# Patient Record
Sex: Female | Born: 1980 | Race: White | Hispanic: No | Marital: Married | Smoking: Never smoker
Health system: Southern US, Community
[De-identification: ages and names within clinical notes are randomized; demographics above are authoritative.]

## PROBLEM LIST (undated history)

## (undated) DIAGNOSIS — Z8619 Personal history of other infectious and parasitic diseases: Secondary | ICD-10-CM

## (undated) DIAGNOSIS — O09529 Supervision of elderly multigravida, unspecified trimester: Secondary | ICD-10-CM

## (undated) HISTORY — DX: Personal history of other infectious and parasitic diseases: Z86.19

## (undated) HISTORY — DX: Supervision of elderly multigravida, unspecified trimester: O09.529

## (undated) HISTORY — PX: WISDOM TOOTH EXTRACTION: SHX21

---

## 2014-10-05 LAB — OB RESULTS CONSOLE HEPATITIS B SURFACE ANTIGEN: Hepatitis B Surface Ag: NEGATIVE

## 2014-10-05 LAB — OB RESULTS CONSOLE ABO/RH: RH TYPE: NEGATIVE

## 2014-10-05 LAB — OB RESULTS CONSOLE RUBELLA ANTIBODY, IGM: Rubella: IMMUNE

## 2014-10-05 LAB — OB RESULTS CONSOLE GC/CHLAMYDIA
Chlamydia: NEGATIVE
Gonorrhea: NEGATIVE

## 2014-10-05 LAB — OB RESULTS CONSOLE ANTIBODY SCREEN: ANTIBODY SCREEN: NEGATIVE

## 2014-10-05 LAB — OB RESULTS CONSOLE HIV ANTIBODY (ROUTINE TESTING): HIV: NONREACTIVE

## 2014-10-05 LAB — OB RESULTS CONSOLE RPR: RPR: NONREACTIVE

## 2014-10-28 NOTE — L&D Delivery Note (Signed)
Delivery Note  SVD viable female Apgars 8,9 over 2nd degree ml lac.  Nuchal x 2 reduced.  Placenta delivered spontaneously intact with 3VC. Repair with 2-0 Chromic with good support and hemostasis noted and R/V exam confirms.  PH art was sent.  [place] cord blood was not done.  Mother and baby were doing well.  EBL 35cc  Candice Camp, MD

## 2015-05-16 ENCOUNTER — Encounter (HOSPITAL_COMMUNITY): Payer: Self-pay | Admitting: *Deleted

## 2015-05-16 ENCOUNTER — Telehealth (HOSPITAL_COMMUNITY): Payer: Self-pay | Admitting: *Deleted

## 2015-05-16 LAB — OB RESULTS CONSOLE GBS: GBS: NEGATIVE

## 2015-05-16 NOTE — Telephone Encounter (Signed)
Preadmission screen  

## 2015-05-19 ENCOUNTER — Inpatient Hospital Stay (HOSPITAL_COMMUNITY): Payer: 59 | Admitting: Anesthesiology

## 2015-05-19 ENCOUNTER — Encounter (HOSPITAL_COMMUNITY): Payer: Self-pay

## 2015-05-19 ENCOUNTER — Inpatient Hospital Stay (HOSPITAL_COMMUNITY)
Admission: RE | Admit: 2015-05-19 | Discharge: 2015-05-21 | DRG: 775 | Disposition: A | Discharge status: Home / Self Care | Payer: 59 | Source: Ambulatory Visit | Attending: Obstetrics and Gynecology | Admitting: Obstetrics and Gynecology

## 2015-05-19 DIAGNOSIS — Z3A40 40 weeks gestation of pregnancy: Secondary | ICD-10-CM | POA: Diagnosis present

## 2015-05-19 DIAGNOSIS — O480 Post-term pregnancy: Secondary | ICD-10-CM | POA: Diagnosis present

## 2015-05-19 DIAGNOSIS — O09523 Supervision of elderly multigravida, third trimester: Secondary | ICD-10-CM | POA: Diagnosis not present

## 2015-05-19 DIAGNOSIS — Z833 Family history of diabetes mellitus: Secondary | ICD-10-CM

## 2015-05-19 LAB — RPR: RPR Ser Ql: NONREACTIVE

## 2015-05-19 LAB — CBC
HCT: 34.8 % — ABNORMAL LOW (ref 36.0–46.0)
Hemoglobin: 11.9 g/dL — ABNORMAL LOW (ref 12.0–15.0)
MCH: 29.1 pg (ref 26.0–34.0)
MCHC: 34.2 g/dL (ref 30.0–36.0)
MCV: 85.1 fL (ref 78.0–100.0)
Platelets: 208 10*3/uL (ref 150–400)
RBC: 4.09 MIL/uL (ref 3.87–5.11)
RDW: 14.2 % (ref 11.5–15.5)
WBC: 12.5 10*3/uL — AB (ref 4.0–10.5)

## 2015-05-19 MED ORDER — ACETAMINOPHEN 325 MG PO TABS: 650.0000 mg | ORAL_TABLET | ORAL | Status: DC | PRN

## 2015-05-19 MED ORDER — DIPHENHYDRAMINE HCL 25 MG PO CAPS: 25.0000 mg | ORAL_CAPSULE | Freq: Four times a day (QID) | ORAL | Status: DC | PRN

## 2015-05-19 MED ORDER — PRENATAL MULTIVITAMIN CH
1.0000 | ORAL_TABLET | Freq: Every day | ORAL | Status: DC
  Administered 2015-05-20: 1 via ORAL
  Filled 2015-05-19: qty 1

## 2015-05-19 MED ORDER — IBUPROFEN 600 MG PO TABS
600.0000 mg | ORAL_TABLET | Freq: Four times a day (QID) | ORAL | Status: DC
  Administered 2015-05-19 – 2015-05-21 (×6): 600 mg via ORAL
  Filled 2015-05-19 (×6): qty 1

## 2015-05-19 MED ORDER — BENZOCAINE-MENTHOL 20-0.5 % EX AERO
1.0000 "application " | INHALATION_SPRAY | CUTANEOUS | Status: DC | PRN
  Administered 2015-05-19 – 2015-05-21 (×2): 1 via TOPICAL
  Filled 2015-05-19 (×2): qty 56

## 2015-05-19 MED ORDER — PHENYLEPHRINE 40 MCG/ML (10ML) SYRINGE FOR IV PUSH (FOR BLOOD PRESSURE SUPPORT)
80.0000 ug | PREFILLED_SYRINGE | INTRAVENOUS | Status: DC | PRN
  Filled 2015-05-19: qty 20
  Filled 2015-05-19: qty 2
  Filled 2015-05-19: qty 20

## 2015-05-19 MED ORDER — MEDROXYPROGESTERONE ACETATE 150 MG/ML IM SUSP: 150.0000 mg | INTRAMUSCULAR | Status: DC | PRN

## 2015-05-19 MED ORDER — ONDANSETRON HCL 4 MG/2ML IJ SOLN: 4.0000 mg | Freq: Four times a day (QID) | INTRAMUSCULAR | Status: DC | PRN

## 2015-05-19 MED ORDER — OXYTOCIN 40 UNITS IN LACTATED RINGERS INFUSION - SIMPLE MED: 62.5000 mL/h | INTRAVENOUS | Status: DC

## 2015-05-19 MED ORDER — LACTATED RINGERS IV SOLN
INTRAVENOUS | Status: DC
  Administered 2015-05-19 (×3): via INTRAVENOUS

## 2015-05-19 MED ORDER — WITCH HAZEL-GLYCERIN EX PADS: 1.0000 "application " | MEDICATED_PAD | CUTANEOUS | Status: DC | PRN

## 2015-05-19 MED ORDER — OXYCODONE-ACETAMINOPHEN 5-325 MG PO TABS: 1.0000 | ORAL_TABLET | ORAL | Status: DC | PRN

## 2015-05-19 MED ORDER — LIDOCAINE HCL (PF) 1 % IJ SOLN
30.0000 mL | INTRAMUSCULAR | Status: DC | PRN
  Filled 2015-05-19: qty 30

## 2015-05-19 MED ORDER — EPHEDRINE 5 MG/ML INJ
INTRAVENOUS | Status: AC
  Filled 2015-05-19: qty 4

## 2015-05-19 MED ORDER — FENTANYL 2.5 MCG/ML BUPIVACAINE 1/10 % EPIDURAL INFUSION (WH - ANES)
14.0000 mL/h | INTRAMUSCULAR | Status: DC | PRN
  Administered 2015-05-19: 14 mL/h via EPIDURAL
  Filled 2015-05-19: qty 125

## 2015-05-19 MED ORDER — LIDOCAINE HCL (PF) 1 % IJ SOLN
INTRAMUSCULAR | Status: DC | PRN
  Administered 2015-05-19: 8 mL via EPIDURAL
  Administered 2015-05-19: 9 mL via EPIDURAL

## 2015-05-19 MED ORDER — LACTATED RINGERS IV SOLN
500.0000 mL | INTRAVENOUS | Status: DC | PRN
  Administered 2015-05-19 (×3): 500 mL via INTRAVENOUS

## 2015-05-19 MED ORDER — MEASLES, MUMPS & RUBELLA VAC ~~LOC~~ INJ: 0.5000 mL | INJECTION | Freq: Once | SUBCUTANEOUS | Status: DC

## 2015-05-19 MED ORDER — DIBUCAINE 1 % RE OINT: 1.0000 "application " | TOPICAL_OINTMENT | RECTAL | Status: DC | PRN

## 2015-05-19 MED ORDER — TERBUTALINE SULFATE 1 MG/ML IJ SOLN
0.2500 mg | Freq: Once | INTRAMUSCULAR | Status: DC | PRN
  Filled 2015-05-19: qty 1

## 2015-05-19 MED ORDER — LANOLIN HYDROUS EX OINT: TOPICAL_OINTMENT | CUTANEOUS | Status: DC | PRN

## 2015-05-19 MED ORDER — CITRIC ACID-SODIUM CITRATE 334-500 MG/5ML PO SOLN: 30.0000 mL | ORAL | Status: DC | PRN

## 2015-05-19 MED ORDER — OXYTOCIN 40 UNITS IN LACTATED RINGERS INFUSION - SIMPLE MED
1.0000 m[IU]/min | INTRAVENOUS | Status: DC
  Administered 2015-05-19: 2 m[IU]/min via INTRAVENOUS
  Filled 2015-05-19: qty 1000

## 2015-05-19 MED ORDER — OXYCODONE-ACETAMINOPHEN 5-325 MG PO TABS: 2.0000 | ORAL_TABLET | ORAL | Status: DC | PRN

## 2015-05-19 MED ORDER — ONDANSETRON HCL 4 MG/2ML IJ SOLN: 4.0000 mg | INTRAMUSCULAR | Status: DC | PRN

## 2015-05-19 MED ORDER — OXYTOCIN BOLUS FROM INFUSION
500.0000 mL | INTRAVENOUS | Status: DC
  Administered 2015-05-19: 500 mL via INTRAVENOUS

## 2015-05-19 MED ORDER — SENNOSIDES-DOCUSATE SODIUM 8.6-50 MG PO TABS
2.0000 | ORAL_TABLET | ORAL | Status: DC
  Administered 2015-05-19 – 2015-05-20 (×2): 2 via ORAL
  Filled 2015-05-19 (×2): qty 2

## 2015-05-19 MED ORDER — ONDANSETRON HCL 4 MG PO TABS: 4.0000 mg | ORAL_TABLET | ORAL | Status: DC | PRN

## 2015-05-19 MED ORDER — TETANUS-DIPHTH-ACELL PERTUSSIS 5-2.5-18.5 LF-MCG/0.5 IM SUSP: 0.5000 mL | Freq: Once | INTRAMUSCULAR | Status: DC

## 2015-05-19 MED ORDER — SIMETHICONE 80 MG PO CHEW: 80.0000 mg | CHEWABLE_TABLET | ORAL | Status: DC | PRN

## 2015-05-19 MED ORDER — ZOLPIDEM TARTRATE 5 MG PO TABS: 5.0000 mg | ORAL_TABLET | Freq: Every evening | ORAL | Status: DC | PRN

## 2015-05-19 MED ORDER — EPHEDRINE 5 MG/ML INJ
10.0000 mg | INTRAVENOUS | Status: AC | PRN
  Administered 2015-05-19: 10 mg via INTRAVENOUS
  Administered 2015-05-19: 15 mg via INTRAVENOUS
  Filled 2015-05-19: qty 4

## 2015-05-19 MED ORDER — DIPHENHYDRAMINE HCL 50 MG/ML IJ SOLN: 12.5000 mg | INTRAMUSCULAR | Status: DC | PRN

## 2015-05-19 NOTE — H&P (Signed)
Suzanne Love is a 34 y.o. female presenting for IOL due to postdates.  Preg complicated by AMA with normal NIPT.. GBS-. History OB History    Gravida Para Term Preterm AB TAB SAB Ectopic Multiple Living   Past Medical History  Diagnosis Date  . Hx of varicella   . AMA (advanced maternal age) multigravida 35+    Past Surgical History  Procedure Laterality Date  . Wisdom tooth extraction     Family History: family history includes Cancer in her father, mother, and paternal grandfather; Diabetes in her paternal grandfather and paternal uncle. Social History:  reports that she has never smoked. She has never used smokeless tobacco. She reports that she does not drink alcohol or use illicit drugs.   Prenatal Transfer Tool  Maternal Diabetes: No Genetic Screening: Normal Maternal Ultrasounds/Referrals: Normal Fetal Ultrasounds or other Referrals:  None Maternal Substance Abuse:  No Significant Maternal Medications:  None Significant Maternal Lab Results:  None Other Comments:  None  ROS  Dilation: 3 Effacement (%): 60 Station: -2 Exam by:: Dr Rana Snare Blood pressure 96/72, pulse 92, temperature 98.2 F (36.8 C), temperature source Oral, resp. rate 20, height  (1.702 m), weight 162 lb (73.483 kg), last menstrual period 08/08/2014. Exam Physical Exam  Prenatal labs: ABO, Rh: O/Negative/-- (12/09 0000) Antibody: Negative (12/09 0000) Rubella: Immune (12/09 0000) RPR: Nonreactive (12/09 0000)  HBsAg: Negative (12/09 0000)  HIV: Non-reactive (12/09 0000)  GBS: Negative (07/19 0000)   Assessment/Plan: IUP at term (post dates) AROM Pitocin Anticipate SVD   Suzanne Love C 05/19/2015, 9:02 AM

## 2015-05-19 NOTE — Anesthesia Preprocedure Evaluation (Signed)
Anesthesia Evaluation  Patient identified by MRN, date of birth, ID band Patient awake    Reviewed: Allergy & Precautions, H&P , NPO status , Patient's Chart, lab work & pertinent test results  Airway Mallampati: I  TM Distance: >3 FB Neck ROM: full    Dental no notable dental hx.    Pulmonary neg pulmonary ROS,  breath sounds clear to auscultation  Pulmonary exam normal       Cardiovascular negative cardio ROS Normal cardiovascular exam    Neuro/Psych negative neurological ROS  negative psych ROS   GI/Hepatic negative GI ROS, Neg liver ROS,   Endo/Other  negative endocrine ROS  Renal/GU negative Renal ROS     Musculoskeletal   Abdominal   Peds  Hematology negative hematology ROS (+)   Anesthesia Other Findings   Reproductive/Obstetrics (+) Pregnancy                             Anesthesia Physical Anesthesia Plan  ASA: II  Anesthesia Plan: Epidural   Post-op Pain Management:    Induction:   Airway Management Planned:   Additional Equipment:   Intra-op Plan:   Post-operative Plan:   Informed Consent: I have reviewed the patients History and Physical, chart, labs and discussed the procedure including the risks, benefits and alternatives for the proposed anesthesia with the patient or authorized representative who has indicated his/her understanding and acceptance.     Plan Discussed with:   Anesthesia Plan Comments:         Anesthesia Quick Evaluation

## 2015-05-19 NOTE — Anesthesia Procedure Notes (Signed)
Epidural Patient location during procedure: OB Start time: 05/19/2015 12:13 PM End time: 05/19/2015 12:17 PM  Staffing Anesthesiologist: Leilani Able Performed by: anesthesiologist   Preanesthetic Checklist Completed: patient identified, surgical consent, pre-op evaluation, timeout performed, IV checked, risks and benefits discussed and monitors and equipment checked  Epidural Patient position: sitting Prep: site prepped and draped and DuraPrep Patient monitoring: continuous pulse ox and blood pressure Approach: midline Location: L3-L4 Injection technique: LOR air  Needle:  Needle type: Tuohy  Needle gauge: 17 G Needle length: 9 cm and 9 Needle insertion depth: 5 cm cm Catheter type: closed end flexible Catheter size: 19 Gauge Catheter at skin depth: 10 cm Test dose: negative and Other  Assessment Sensory level: T9 Events: blood not aspirated, injection not painful, no injection resistance, negative IV test and no paresthesia  Additional Notes Reason for block:procedure for pain

## 2015-05-20 LAB — CBC
HCT: 33.4 % — ABNORMAL LOW (ref 36.0–46.0)
HEMOGLOBIN: 11.2 g/dL — AB (ref 12.0–15.0)
MCH: 28.9 pg (ref 26.0–34.0)
MCHC: 33.5 g/dL (ref 30.0–36.0)
MCV: 86.1 fL (ref 78.0–100.0)
Platelets: 186 10*3/uL (ref 150–400)
RBC: 3.88 MIL/uL (ref 3.87–5.11)
RDW: 14.6 % (ref 11.5–15.5)
WBC: 19.6 10*3/uL — AB (ref 4.0–10.5)

## 2015-05-20 MED ORDER — RHO D IMMUNE GLOBULIN 1500 UNIT/2ML IJ SOSY
300.0000 ug | PREFILLED_SYRINGE | Freq: Once | INTRAMUSCULAR | Status: AC
  Administered 2015-05-20: 300 ug via INTRAMUSCULAR
  Filled 2015-05-20: qty 2

## 2015-05-20 NOTE — Anesthesia Postprocedure Evaluation (Signed)
  Anesthesia Post-op Note  Patient: Suzanne Love  Procedure(s) Performed: * No procedures listed *  Patient Location: Mother/Baby  Anesthesia Type:Epidural  Level of Consciousness: awake, alert , oriented and patient cooperative  Airway and Oxygen Therapy: Patient Spontanous Breathing  Post-op Pain: none  Post-op Assessment: Post-op Vital signs reviewed, Patient's Cardiovascular Status Stable, Respiratory Function Stable, Patent Airway, No headache, No backache and Patient able to bend at knees              Post-op Vital Signs: Reviewed and stable  Last Vitals:  Filed Vitals:   05/20/15 0055  BP: 92/53  Pulse: 63  Temp: 36.7 C  Resp: 16    Complications: No apparent anesthesia complications

## 2015-05-20 NOTE — Lactation Note (Signed)
This note was copied from the chart of Suzanne Jennafer Gladue. Lactation Consultation Note  Patient Name: Suzanne Love Date: 05/20/2015 Reason for consult: Initial assessment Visited with Mom and FOB, baby 23 hrs old.  This is baby's 6th feeding.  Mom states baby sometimes acts sleepy, but with this one, he is very nutritive.  Mom has baby positioned in football hold, supporting his head and her breast.  Encouraged breast compression during feeding.  Manual breast expression recommended for nipples post feedings. Basics reviewed and some questions answered.  Encouraged skin to skin, and feeding baby often on cue.  Discussed probably cluster feedings tonight.  Brochure left in room.  Informed Mom of IP and OP lactation services available to her.  Encouraged her to call for assistance prn.  Follow up in am.    Consult Status Consult Status: Follow-up Date: 05/21/15 Follow-up type: In-patient    Suzanne Love 05/20/2015, 3:21 PM

## 2015-05-20 NOTE — Progress Notes (Signed)
Post Partum Day 1 Subjective: no complaints, up ad lib, voiding and tolerating PO  Objective: Blood pressure 92/53, pulse 63, temperature 98 F (36.7 C), temperature source Oral, resp. rate 16, height  (1.702 m), weight 162 lb (73.483 kg), last menstrual period 08/08/2014, SpO2 98 %, unknown if currently breastfeeding.  Physical Exam:  General: alert, cooperative, appears stated age and no distress Lochia: appropriate Uterine Fundus: firm Incision: healing well DVT Evaluation: No evidence of DVT seen on physical exam.   Recent Labs  05/19/15 0805 05/20/15 0555  HGB 11.9* 11.2*  HCT 34.8* 33.4*    Assessment/Plan: Plan for discharge tomorrow, Breastfeeding and Circumcision prior to discharge   LOS: 1 day   Mikaili Flippin C 05/20/2015, 10:07 AM

## 2015-05-21 ENCOUNTER — Ambulatory Visit: Payer: Self-pay

## 2015-05-21 LAB — TYPE AND SCREEN
ABO/RH(D): O NEG
Antibody Screen: POSITIVE
DAT, IgG: NEGATIVE
UNIT DIVISION: 0
Unit division: 0

## 2015-05-21 LAB — RH IG WORKUP (INCLUDES ABO/RH)
ABO/RH(D): O NEG
Fetal Screen: NEGATIVE
GESTATIONAL AGE(WKS): 40.3
UNIT DIVISION: 0

## 2015-05-21 MED ORDER — IBUPROFEN 600 MG PO TABS: 600.0000 mg | ORAL_TABLET | Freq: Four times a day (QID) | ORAL | Status: AC

## 2015-05-21 NOTE — Lactation Note (Signed)
This note was copied from the chart of Suzanne Laprecious Austill. Lactation Consultation Note  Patient Name: Suzanne Love [id number] Date: 05/21/2015 Reason for consult: Follow-up assessment (mom and baby D/C prior to [facility] visit. )  LC had reviewed the doc flow sheets - 5% weight loss, breast feeding range 10 -40 mins , Latch score 05-04-09  Voids and stools adequate for age. At 0605 Bili 8.4.  Per [facility] RN baby had breast fed 2 x's prior to D/C 45 mins and 15 mins and breast feeding was going well.   Maternal Data    Feeding    LATCH Score/Interventions                      Lactation Tools Discussed/Used     Consult Status Consult Status: Complete Date: 05/21/15    Kathrin Greathouse 05/21/2015, 2:55 PM

## 2015-05-21 NOTE — Discharge Summary (Signed)
Obstetric Discharge Summary Reason for Admission: induction of labor Prenatal Procedures: none Intrapartum Procedures: spontaneous vaginal delivery Postpartum Procedures: none Complications-Operative and Postpartum: none HEMOGLOBIN  Date Value Ref Range Status  05/20/2015 11.2* 12.0 - 15.0 g/dL Final   HCT  Date Value Ref Range Status  05/20/2015 33.4* 36.0 - 46.0 % Final    Physical Exam:  General: alert, cooperative, appears stated age and no distress Lochia: appropriate Uterine Fundus: firm Incision: healing well DVT Evaluation: No evidence of DVT seen on physical exam.  Discharge Diagnoses: Term Pregnancy-delivered  Discharge Information: Date: 05/21/2015 Activity: pelvic rest Diet: routine Medications: Percocet Condition: stable Instructions: refer to practice specific booklet Discharge to: home   Newborn Data: Live born female  Birth Weight: 7 lb 14.8 oz (3595 g) APGAR: 8, 9  Home with mother s/p circumcision.  Brenya Taulbee C 05/21/2015, 9:41 AM

## 2015-11-21 MED FILL — AMOXICILLIN 500 MG CAPSULE: 500 | 10 days supply | Qty: 30 | Fill #0

## 2015-11-21 MED FILL — AZELASTINE HCL 137 MCG SPRY: 0.1 | 30 days supply | Qty: 30 | Fill #0

## 2017-04-03 DIAGNOSIS — L309 Dermatitis, unspecified: Secondary | ICD-10-CM | POA: Diagnosis not present

## 2017-08-15 DIAGNOSIS — L309 Dermatitis, unspecified: Secondary | ICD-10-CM | POA: Diagnosis not present

## 2017-08-15 MED FILL — TRIAMCINOLONE 0.1% CREAM: 0.1 | 10 days supply | Qty: 30 | Fill #0

## 2017-12-28 ENCOUNTER — Other Ambulatory Visit: Payer: Self-pay | Admitting: Family

## 2017-12-28 MED ORDER — OSELTAMIVIR PHOSPHATE 75 MG PO CAPS: 75.0000 mg | ORAL_CAPSULE | Freq: Every day | ORAL | 0 refills | Status: AC

## 2018-04-08 DIAGNOSIS — Z136 Encounter for screening for cardiovascular disorders: Secondary | ICD-10-CM | POA: Diagnosis not present

## 2018-04-08 DIAGNOSIS — Z131 Encounter for screening for diabetes mellitus: Secondary | ICD-10-CM | POA: Diagnosis not present

## 2018-04-08 DIAGNOSIS — Z0000 Encounter for general adult medical examination without abnormal findings: Secondary | ICD-10-CM | POA: Diagnosis not present

## 2019-04-13 DIAGNOSIS — Z01419 Encounter for gynecological examination (general) (routine) without abnormal findings: Secondary | ICD-10-CM | POA: Diagnosis not present

## 2019-04-13 DIAGNOSIS — Z6821 Body mass index (BMI) 21.0-21.9, adult: Secondary | ICD-10-CM | POA: Diagnosis not present

## 2019-04-14 ENCOUNTER — Other Ambulatory Visit: Payer: Self-pay | Admitting: Obstetrics and Gynecology

## 2019-04-14 DIAGNOSIS — N6320 Unspecified lump in the left breast, unspecified quadrant: Secondary | ICD-10-CM

## 2019-04-21 ENCOUNTER — Other Ambulatory Visit: Payer: 59

## 2019-04-26 ENCOUNTER — Ambulatory Visit
Admission: RE | Admit: 2019-04-26 | Discharge: 2019-04-26 | Disposition: A | Discharge status: Home / Self Care | Payer: 59 | Source: Ambulatory Visit | Attending: Obstetrics and Gynecology | Admitting: Obstetrics and Gynecology

## 2019-04-26 ENCOUNTER — Other Ambulatory Visit: Payer: Self-pay

## 2019-04-26 DIAGNOSIS — N6320 Unspecified lump in the left breast, unspecified quadrant: Secondary | ICD-10-CM

## 2019-04-26 DIAGNOSIS — N6489 Other specified disorders of breast: Secondary | ICD-10-CM | POA: Diagnosis not present

## 2019-04-26 DIAGNOSIS — R922 Inconclusive mammogram: Secondary | ICD-10-CM | POA: Diagnosis not present

## 2019-05-05 DIAGNOSIS — Z131 Encounter for screening for diabetes mellitus: Secondary | ICD-10-CM | POA: Diagnosis not present

## 2019-05-05 DIAGNOSIS — Z0000 Encounter for general adult medical examination without abnormal findings: Secondary | ICD-10-CM | POA: Diagnosis not present

## 2019-05-05 DIAGNOSIS — Z13220 Encounter for screening for lipoid disorders: Secondary | ICD-10-CM | POA: Diagnosis not present

## 2019-11-02 ENCOUNTER — Ambulatory Visit: Payer: 59 | Attending: Internal Medicine

## 2019-11-02 DIAGNOSIS — Z20822 Contact with and (suspected) exposure to covid-19: Secondary | ICD-10-CM | POA: Diagnosis not present

## 2019-11-04 LAB — NOVEL CORONAVIRUS, NAA: SARS-CoV-2, NAA: NOT DETECTED

## 2020-02-28 DIAGNOSIS — Z30433 Encounter for removal and reinsertion of intrauterine contraceptive device: Secondary | ICD-10-CM | POA: Diagnosis not present

## 2020-02-28 IMAGING — MG DIGITAL DIAGNOSTIC BILATERAL MAMMOGRAM WITH TOMO AND CAD
8 series · 8 of 24 positions shown · non-contrast
Comparison: None.

CLINICAL DATA: 37-year-old female with the physician palpated left
breast lump.The patient states the discrepancy in size between her
breasts have always been that way.

EXAM:
DIGITAL DIAGNOSTIC BILATERAL MAMMOGRAM WITH CAD AND TOMO
ULTRASOUND LEFT BREAST

[L CC synth-2D]
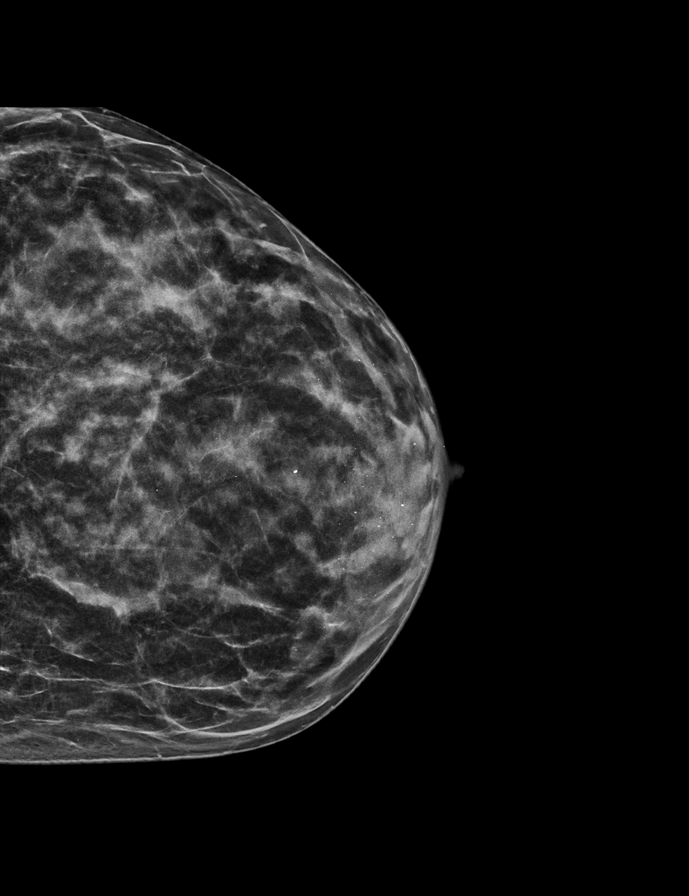

[L MLO synth-2D]
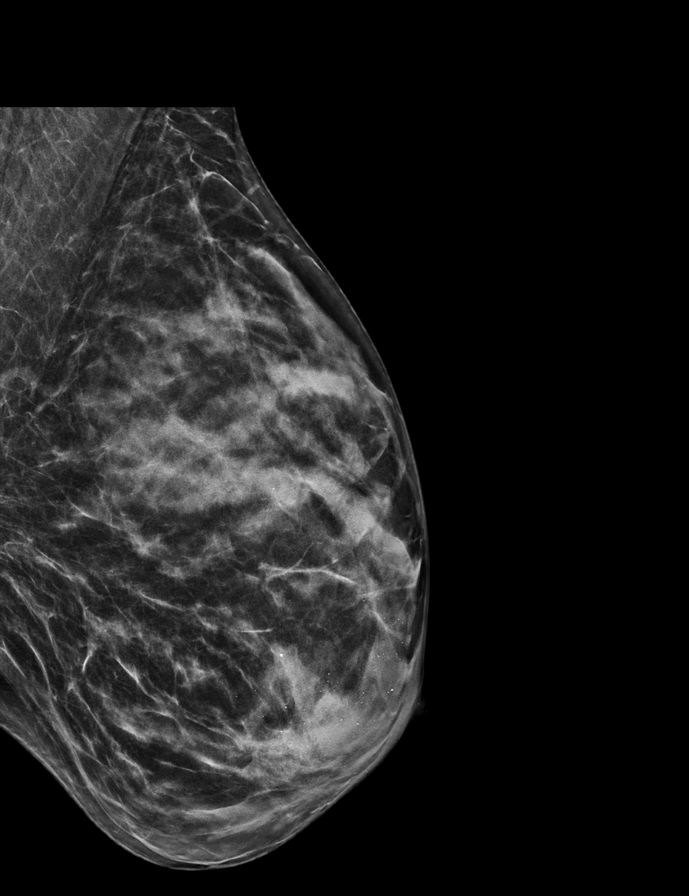

[R MLO synth-2D]
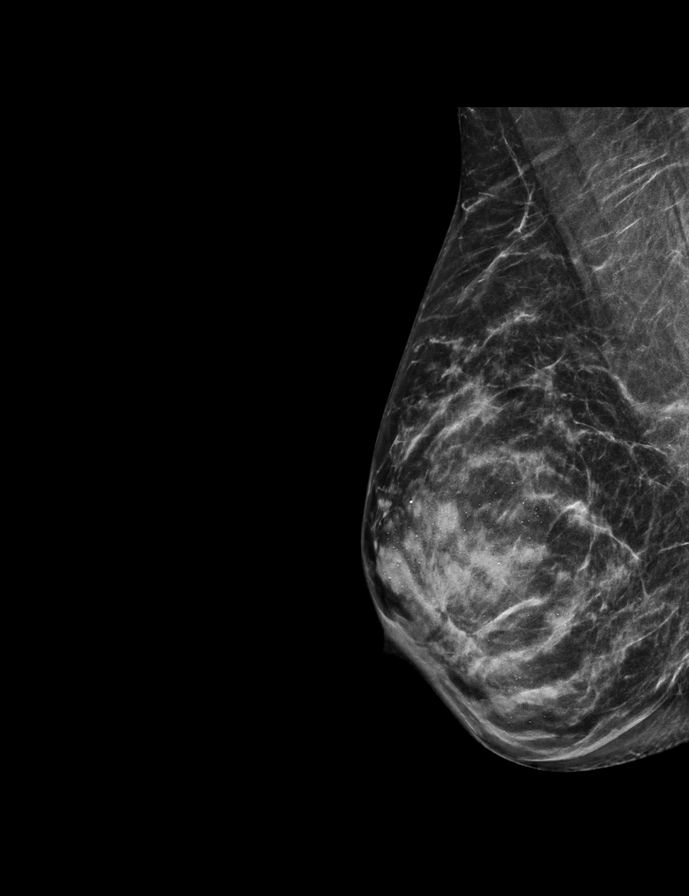

[R CC synth-2D]
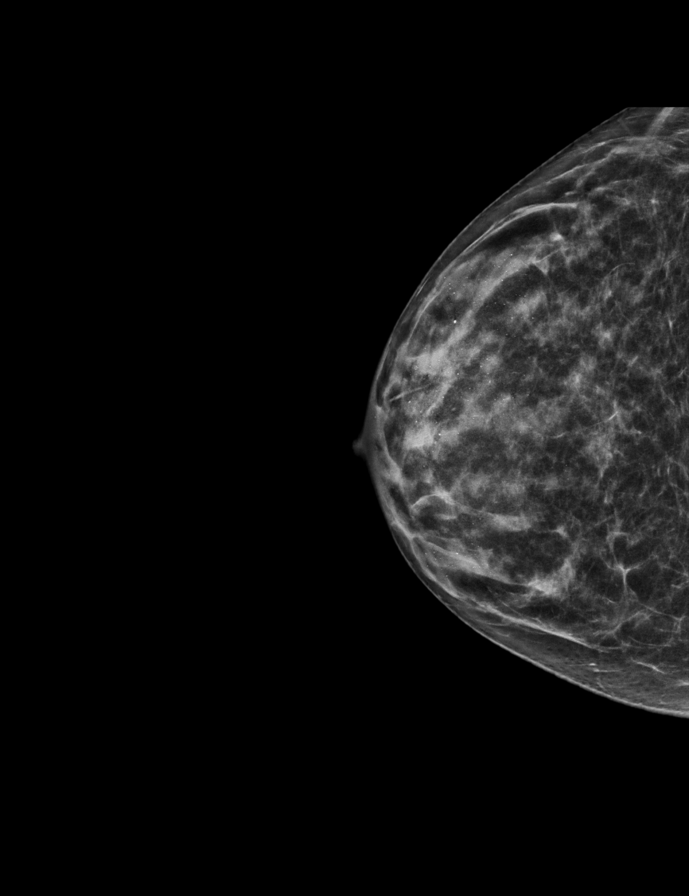

[R MLO tomo · tomo slice 28/55.0]
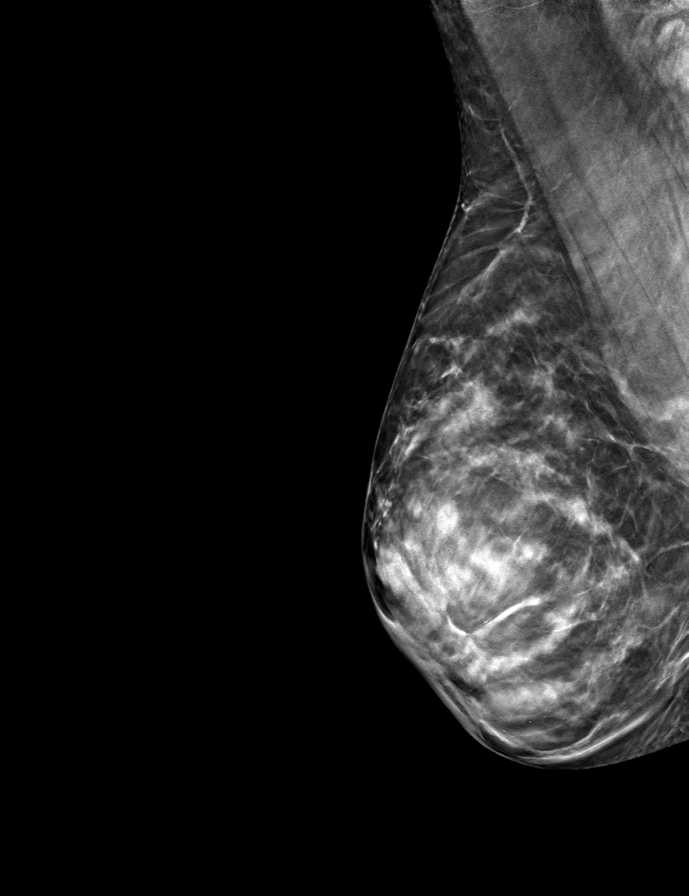

[L CC tomo · tomo slice 25/50.0]
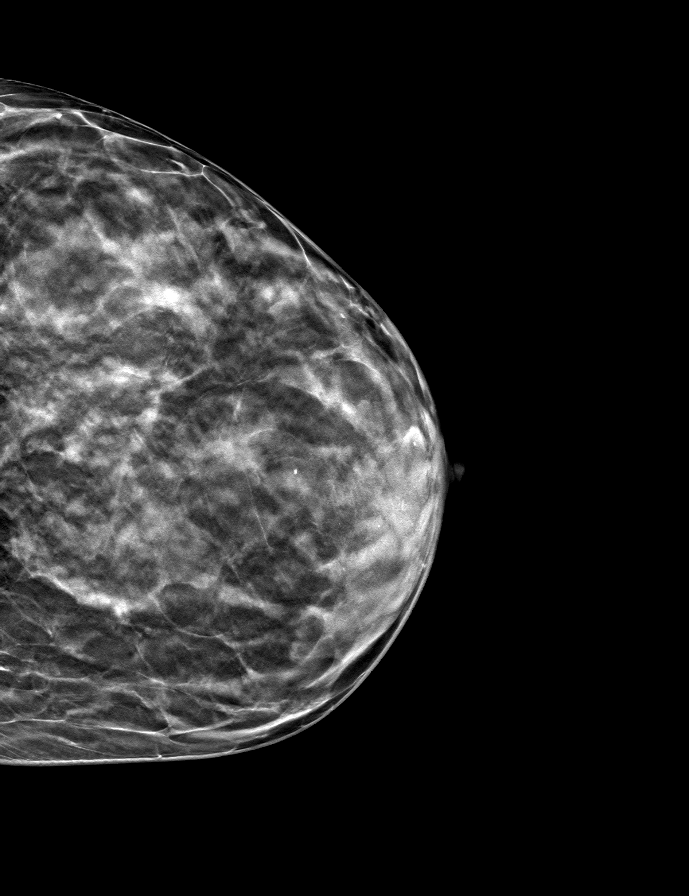

[L MLO tomo · tomo slice 29/56.0]
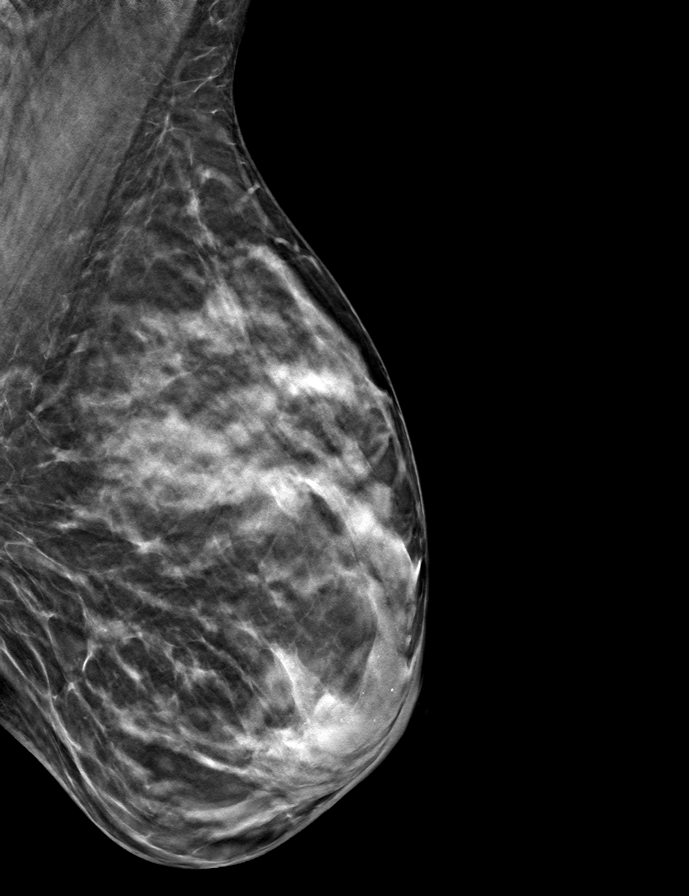

[R CC tomo · tomo slice 26/51.0]
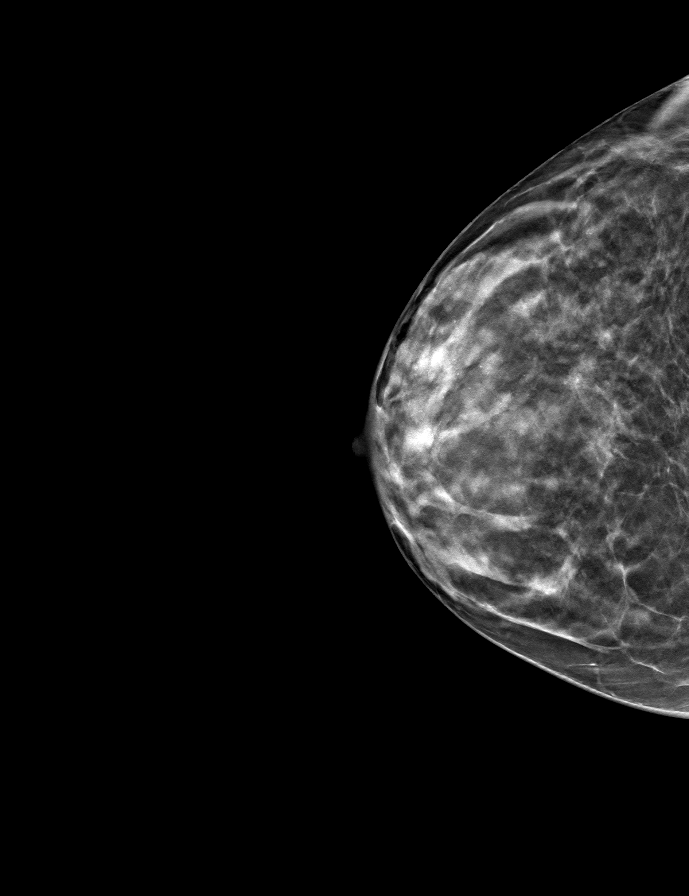

[8 of 24 positions shown; findings below may reference images not displayed]

ACR Breast Density Category c: The breast tissue is heterogeneously
dense, which may obscure small masses.
FINDINGS: No suspicious mammographic findings are identified in either breast.
Typically benign round and punctate calcifications are scattered
bilaterally.

Mammographic images were processed with CAD.

On physical exam, I palpate heterogenous fibroglandular tissue
without focal suspicious lump in the lateral left breast.

Targeted ultrasound is performed, showing dense fibroglandular
tissue without focal or suspicious sonographic abnormalities the.
Evaluation of the lower outer quadrant was performed.
IMPRESSION: No suspicious mammographic or sonographic findings in either breast.

RECOMMENDATION:
1. Clinical follow-up recommended for the palpable area of concern
in the left breast. Any further workup should be based on clinical
grounds.
2.  Screening mammogram in one year.(Code:[id number])

I have discussed the findings and recommendations with the patient.
Results were also provided in writing at the conclusion of the
visit. If applicable, a reminder letter will be sent to the patient
regarding the next appointment.

BI-RADS CATEGORY  2: Benign.

## 2020-03-13 ENCOUNTER — Other Ambulatory Visit: Payer: Self-pay | Admitting: Obstetrics and Gynecology

## 2020-03-13 DIAGNOSIS — Z1231 Encounter for screening mammogram for malignant neoplasm of breast: Secondary | ICD-10-CM

## 2020-04-19 DIAGNOSIS — Z01419 Encounter for gynecological examination (general) (routine) without abnormal findings: Secondary | ICD-10-CM | POA: Diagnosis not present

## 2020-04-19 DIAGNOSIS — Z6821 Body mass index (BMI) 21.0-21.9, adult: Secondary | ICD-10-CM | POA: Diagnosis not present

## 2020-05-03 ENCOUNTER — Ambulatory Visit
Admission: RE | Admit: 2020-05-03 | Discharge: 2020-05-03 | Disposition: A | Discharge status: Home / Self Care | Payer: 59 | Source: Ambulatory Visit | Attending: Obstetrics and Gynecology | Admitting: Obstetrics and Gynecology

## 2020-05-03 ENCOUNTER — Other Ambulatory Visit: Payer: Self-pay

## 2020-05-03 DIAGNOSIS — Z1231 Encounter for screening mammogram for malignant neoplasm of breast: Secondary | ICD-10-CM | POA: Diagnosis not present

## 2020-05-24 DIAGNOSIS — Z0000 Encounter for general adult medical examination without abnormal findings: Secondary | ICD-10-CM | POA: Diagnosis not present

## 2020-05-24 DIAGNOSIS — E785 Hyperlipidemia, unspecified: Secondary | ICD-10-CM | POA: Diagnosis not present

## 2020-06-12 ENCOUNTER — Telehealth: Payer: 59 | Admitting: Family

## 2020-06-12 ENCOUNTER — Telehealth: Payer: 59

## 2020-06-12 DIAGNOSIS — L239 Allergic contact dermatitis, unspecified cause: Secondary | ICD-10-CM

## 2020-06-12 MED ORDER — TRIAMCINOLONE ACETONIDE 0.5 % EX OINT: 1.0000 "application " | TOPICAL_OINTMENT | Freq: Two times a day (BID) | CUTANEOUS | 0 refills | Status: AC

## 2020-06-12 MED ORDER — PREDNISONE 10 MG (21) PO TBPK: ORAL_TABLET | ORAL | 0 refills | Status: DC

## 2020-06-12 NOTE — Progress Notes (Signed)
E Visit for Rash  We are sorry that you are not feeling well. Here is how we plan to help!  Based on what you shared with me it looks like you have contact dermatitis.  Contact dermatitis is a skin rash caused by something that touches the skin and causes irritation or inflammation.  Your skin may be red, swollen, dry, cracked, and itch.  The rash should go away in a few days but can last a few weeks.  If you get a rash, it's important to figure out what caused it so the irritant can be avoided in the future.  I am sending you a prednisone dose pack and kenalog cream that you will use twice a day.   Prednisone 10 mg daily for 6 days (see taper instructions below)  Directions for 6 day taper: Day 1: 2 tablets before breakfast, 1 after both lunch & dinner and 2 at bedtime Day 2: 1 tab before breakfast, 1 after both lunch & dinner and 2 at bedtime Day 3: 1 tab at each meal & 1 at bedtime Day 4: 1 tab at breakfast, 1 at lunch, 1 at bedtime Day 5: 1 tab at breakfast & 1 tab at bedtime Day 6: 1 tab at breakfast     HOME CARE:   Take cool showers and avoid direct sunlight.  Apply cool compress or wet dressings.  Take a bath in an oatmeal bath.  Sprinkle content of one Aveeno packet under running faucet with comfortably warm water.  Bathe for 15-20 minutes, 1-2 times daily.  Pat dry with a towel. Do not rub the rash.  Use hydrocortisone cream.  Take an antihistamine like Benadryl for widespread rashes that itch.  The adult dose of Benadryl is 25-50 mg by mouth 4 times daily.  Caution:  This type of medication may cause sleepiness.  Do not drink alcohol, drive, or operate dangerous machinery while taking antihistamines.  Do not take these medications if you have prostate enlargement.  Read package instructions thoroughly on all medications that you take.  GET HELP RIGHT AWAY IF:   Symptoms don't go away after treatment.  Severe itching that persists.  If you rash spreads or  swells.  If you rash begins to smell.  If it blisters and opens or develops a yellow-brown crust.  You develop a fever.  You have a sore throat.  You become short of breath.  MAKE SURE YOU:  Understand these instructions. Will watch your condition. Will get help right away if you are not doing well or get worse.  Thank you for choosing an e-visit. Your e-visit answers were reviewed by a board certified advanced clinical practitioner to complete your personal care plan. Depending upon the condition, your plan could have included both over the counter or prescription medications. Please review your pharmacy choice. Be sure that the pharmacy you have chosen is open so that you can pick up your prescription now.  If there is a problem you may message your provider in MyChart to have the prescription routed to another pharmacy. Your safety is important to [place]. If you have drug allergies check your prescription carefully.  For the next 24 hours, you can use MyChart to ask questions about today's visit, request a non-urgent call back, or ask for a work or school excuse from your e-visit provider. You will get an email in the next two days asking about your experience. I hope that your e-visit has been valuable and will speed your  recovery.   Approximately 5 minutes was spent documenting and reviewing patient's chart.

## 2020-06-20 ENCOUNTER — Telehealth: Payer: 59 | Admitting: Physician Assistant

## 2020-06-20 DIAGNOSIS — L309 Dermatitis, unspecified: Secondary | ICD-10-CM

## 2020-06-20 MED ORDER — PREDNISONE 10 MG (21) PO TBPK: ORAL_TABLET | ORAL | 0 refills | Status: AC

## 2020-06-20 MED FILL — predniSONE 10 MG TABS: 10 | 6 days supply | Qty: 21 | Fill #0

## 2020-06-20 NOTE — Progress Notes (Signed)
E Visit for Rash  We are sorry that you are not feeling well. Here is how we plan to help!  Based on what you shared with me it looks like you have contact dermatitis. I reviewed your photos from previous and that is what it appears to be. Sometimes this can take several weeks to go away and will reappear when the prednisone wears.   The other thing that may cause the rash to return is if you are continuing to come into contact with whatever is irritating your skin. If you get a rash, it's important to figure out what caused it so the irritant can be avoided in the future.  Sometimes people need to see a dermatology specialist to figure out what is causing the rash.  Call your primary care provider for a referral.   If you are having any body aches, joint aches, fever, fatigue, trouble breathing or throat swelling you should be seen in person right away.  Otherwise there is not much else to do other than continue benadryl and hydrocortisone cream until you can follow up with your regular doctor. I can send in some additional prednisone for a few days but be aware there are side effects of this medicine which include excessive hunger, thirst, trouble sleeping, frequent urination, elevation in blood pressure and elevation in blood sugar and it is not wise to take prednisone for a long time.   Contact dermatitis is a skin rash caused by something that touches the skin and causes irritation or inflammation.  Your skin may be red, swollen, dry, cracked, and itch.  The rash should go away in a few days but can last a few weeks.     HOME CARE:   Take cool showers and avoid direct sunlight.  Apply cool compress or wet dressings.  Take a bath in an oatmeal bath.  Sprinkle content of one Aveeno packet under running faucet with comfortably warm water.  Bathe for 15-20 minutes, 1-2 times daily.  Pat dry with a towel. Do not rub the rash.  Use hydrocortisone cream.  Take an antihistamine like  Benadryl for widespread rashes that itch.  The adult dose of Benadryl is 25-50 mg by mouth 4 times daily.  Caution:  This type of medication may cause sleepiness.  Do not drink alcohol, drive, or operate dangerous machinery while taking antihistamines.  Do not take these medications if you have prostate enlargement.  Read package instructions thoroughly on all medications that you take.  GET HELP RIGHT AWAY IF:   Symptoms don't go away after treatment.  Severe itching that persists.  If you rash spreads or swells.  If you rash begins to smell.  If it blisters and opens or develops a yellow-brown crust.  You develop a fever.  You have a sore throat.  You become short of breath.  MAKE SURE YOU:  Understand these instructions. Will watch your condition. Will get help right away if you are not doing well or get worse.  Thank you for choosing an e-visit. Your e-visit answers were reviewed by a board certified advanced clinical practitioner to complete your personal care plan. Depending upon the condition, your plan could have included both over the counter or prescription medications. Please review your pharmacy choice. Be sure that the pharmacy you have chosen is open so that you can pick up your prescription now.  If there is a problem you may message your provider in MyChart to have the prescription routed to another  pharmacy. Your safety is important to [place]. If you have drug allergies check your prescription carefully.  For the next 24 hours, you can use MyChart to ask questions about today's visit, request a non-urgent call back, or ask for a work or school excuse from your e-visit provider. You will get an email in the next two days asking about your experience. I hope that your e-visit has been valuable and will speed your recovery.    10 minutes spent on chart

## 2020-06-28 DIAGNOSIS — L309 Dermatitis, unspecified: Secondary | ICD-10-CM | POA: Diagnosis not present

## 2020-06-28 MED FILL — predniSONE 20 MG TABS: 20 | 9 days supply | Qty: 18 | Fill #0
